# Patient Record
Sex: Female | Born: 1955 | Race: White | Hispanic: No | Marital: Single | Smoking: Never smoker
Health system: Southern US, Community
[De-identification: ages and names within clinical notes are randomized; demographics above are authoritative.]

## PROBLEM LIST (undated history)

## (undated) DIAGNOSIS — R51 Headache: Secondary | ICD-10-CM

## (undated) DIAGNOSIS — K219 Gastro-esophageal reflux disease without esophagitis: Secondary | ICD-10-CM

## (undated) DIAGNOSIS — Z8489 Family history of other specified conditions: Secondary | ICD-10-CM

## (undated) DIAGNOSIS — R519 Headache, unspecified: Secondary | ICD-10-CM

## (undated) HISTORY — PX: FINGER SURGERY: SHX640

---

## 1997-10-25 ENCOUNTER — Ambulatory Visit (HOSPITAL_COMMUNITY): Admission: RE | Admit: 1997-10-25 | Discharge: 1997-10-25 | Payer: Self-pay | Admitting: Obstetrics and Gynecology

## 1998-11-04 ENCOUNTER — Ambulatory Visit (HOSPITAL_COMMUNITY): Admission: RE | Admit: 1998-11-04 | Discharge: 1998-11-04 | Payer: Self-pay | Admitting: Obstetrics and Gynecology

## 1998-11-04 ENCOUNTER — Encounter: Payer: Self-pay | Admitting: Obstetrics and Gynecology

## 1999-01-07 ENCOUNTER — Other Ambulatory Visit: Admission: RE | Admit: 1999-01-07 | Discharge: 1999-01-07 | Payer: Self-pay | Admitting: Obstetrics and Gynecology

## 1999-02-11 ENCOUNTER — Other Ambulatory Visit: Admission: RE | Admit: 1999-02-11 | Discharge: 1999-02-11 | Payer: Self-pay | Admitting: Obstetrics and Gynecology

## 1999-02-11 ENCOUNTER — Encounter (INDEPENDENT_AMBULATORY_CARE_PROVIDER_SITE_OTHER): Payer: Self-pay

## 1999-06-04 ENCOUNTER — Other Ambulatory Visit: Admission: RE | Admit: 1999-06-04 | Discharge: 1999-06-04 | Payer: Self-pay | Admitting: Obstetrics and Gynecology

## 1999-12-17 ENCOUNTER — Ambulatory Visit (HOSPITAL_COMMUNITY): Admission: RE | Admit: 1999-12-17 | Discharge: 1999-12-17 | Payer: Self-pay | Admitting: Obstetrics and Gynecology

## 1999-12-17 ENCOUNTER — Encounter: Payer: Self-pay | Admitting: Obstetrics and Gynecology

## 2000-02-18 ENCOUNTER — Other Ambulatory Visit: Admission: RE | Admit: 2000-02-18 | Discharge: 2000-02-18 | Payer: Self-pay | Admitting: Obstetrics and Gynecology

## 2001-02-08 ENCOUNTER — Encounter: Admission: RE | Admit: 2001-02-08 | Discharge: 2001-02-08 | Payer: Self-pay | Admitting: Family Medicine

## 2001-02-08 ENCOUNTER — Encounter: Payer: Self-pay | Admitting: Family Medicine

## 2001-06-05 ENCOUNTER — Encounter: Payer: Self-pay | Admitting: Obstetrics and Gynecology

## 2001-06-05 ENCOUNTER — Ambulatory Visit (HOSPITAL_COMMUNITY): Admission: RE | Admit: 2001-06-05 | Discharge: 2001-06-05 | Payer: Self-pay | Admitting: Obstetrics and Gynecology

## 2001-06-27 ENCOUNTER — Other Ambulatory Visit: Admission: RE | Admit: 2001-06-27 | Discharge: 2001-06-27 | Payer: Self-pay | Admitting: Obstetrics and Gynecology

## 2002-07-26 ENCOUNTER — Other Ambulatory Visit: Admission: RE | Admit: 2002-07-26 | Discharge: 2002-07-26 | Payer: Self-pay | Admitting: Obstetrics and Gynecology

## 2002-12-25 ENCOUNTER — Encounter (INDEPENDENT_AMBULATORY_CARE_PROVIDER_SITE_OTHER): Payer: Self-pay

## 2002-12-25 ENCOUNTER — Ambulatory Visit (HOSPITAL_COMMUNITY): Admission: RE | Admit: 2002-12-25 | Discharge: 2002-12-25 | Payer: Self-pay | Admitting: Specialist

## 2003-09-23 ENCOUNTER — Other Ambulatory Visit: Admission: RE | Admit: 2003-09-23 | Discharge: 2003-09-23 | Payer: Self-pay | Admitting: Obstetrics and Gynecology

## 2005-01-05 ENCOUNTER — Other Ambulatory Visit: Admission: RE | Admit: 2005-01-05 | Discharge: 2005-01-05 | Payer: Self-pay | Admitting: Obstetrics and Gynecology

## 2005-01-15 ENCOUNTER — Encounter: Admission: RE | Admit: 2005-01-15 | Discharge: 2005-01-15 | Payer: Self-pay | Admitting: Obstetrics and Gynecology

## 2005-08-24 ENCOUNTER — Encounter: Admission: RE | Admit: 2005-08-24 | Discharge: 2005-08-24 | Payer: Self-pay | Admitting: Obstetrics and Gynecology

## 2006-02-10 ENCOUNTER — Encounter: Admission: RE | Admit: 2006-02-10 | Discharge: 2006-02-10 | Payer: Self-pay | Admitting: Obstetrics and Gynecology

## 2007-07-11 ENCOUNTER — Encounter: Admission: RE | Admit: 2007-07-11 | Discharge: 2007-07-11 | Payer: Self-pay | Admitting: Obstetrics and Gynecology

## 2008-07-11 ENCOUNTER — Encounter: Admission: RE | Admit: 2008-07-11 | Discharge: 2008-07-11 | Payer: Self-pay | Admitting: Obstetrics and Gynecology

## 2008-07-17 ENCOUNTER — Encounter: Admission: RE | Admit: 2008-07-17 | Discharge: 2008-07-17 | Payer: Self-pay | Admitting: Obstetrics and Gynecology

## 2009-02-19 ENCOUNTER — Encounter: Admission: RE | Admit: 2009-02-19 | Discharge: 2009-02-19 | Payer: Self-pay | Admitting: Obstetrics and Gynecology

## 2009-02-24 ENCOUNTER — Encounter: Admission: RE | Admit: 2009-02-24 | Discharge: 2009-02-24 | Payer: Self-pay | Admitting: Obstetrics and Gynecology

## 2009-09-22 ENCOUNTER — Encounter: Admission: RE | Admit: 2009-09-22 | Discharge: 2009-09-22 | Payer: Self-pay | Admitting: Obstetrics and Gynecology

## 2010-10-02 NOTE — Op Note (Signed)
   NAME:  Savannah Phillips, Savannah Phillips                           ACCOUNT NO.:  [id number]   MEDICAL RECORD NO.:  [id number]                   PATIENT TYPE:  AMB   LOCATION:  DAY                                  FACILITY:  [facility]   PHYSICIAN:  Ronnell Guadalajara, M.D.                DATE OF BIRTH:  January 23, 1956   DATE OF PROCEDURE:  12/25/2002  DATE OF DISCHARGE:                                 OPERATIVE REPORT   PREOPERATIVE DIAGNOSIS:  Ganglion cyst, dorsum of proximal interphalangeal  joint, left ring finger.   POSTOPERATIVE DIAGNOSIS:  Scar tissue, dorsum of proximal interphalangeal  joint, left ring finger.   OPERATIVE PROCEDURE:  Removal of scar tissue.   DESCRIPTION OF PROCEDURE:  After a little bit of sedation, she was given a  digital block intermetacarpal area volar and on the dorsum with 0.5% plain  Marcaine.  A three-quarter inch rubber drain was then used as an Esmarch  beginning at the fingertip and then wrapping it around the base of the  finger and holding with a hemostat clamp.  It was then removed up to the  base of the finger and a dorsal incision was made over the mass.  It was  freed up subcutaneously.  It was not a ganglion cyst but a thick mass of  scar tissue, which was peeled off the dorsum of the extensor hood and sent  to pathology.  Interrupted closure with interrupted 9-0 nylon, three  sutures.  The tourniquet was removed and the finger held until bleeding  subsided, and then a compression dressing.  She goes to recovery in good  condition.                                               Ronnell Guadalajara, M.D.    PC/MEDQ  D:  12/25/2002  T:  12/25/2002  Job:  [id number]

## 2015-12-02 DIAGNOSIS — H00022 Hordeolum internum right lower eyelid: Secondary | ICD-10-CM | POA: Diagnosis not present

## 2015-12-12 DIAGNOSIS — H00022 Hordeolum internum right lower eyelid: Secondary | ICD-10-CM | POA: Diagnosis not present

## 2015-12-25 DIAGNOSIS — E663 Overweight: Secondary | ICD-10-CM | POA: Diagnosis not present

## 2015-12-25 DIAGNOSIS — K635 Polyp of colon: Secondary | ICD-10-CM | POA: Diagnosis not present

## 2015-12-25 DIAGNOSIS — K30 Functional dyspepsia: Secondary | ICD-10-CM | POA: Diagnosis not present

## 2015-12-25 DIAGNOSIS — G43909 Migraine, unspecified, not intractable, without status migrainosus: Secondary | ICD-10-CM | POA: Diagnosis not present

## 2015-12-25 DIAGNOSIS — Z1389 Encounter for screening for other disorder: Secondary | ICD-10-CM | POA: Diagnosis not present

## 2015-12-25 DIAGNOSIS — K439 Ventral hernia without obstruction or gangrene: Secondary | ICD-10-CM | POA: Diagnosis not present

## 2015-12-25 DIAGNOSIS — N951 Menopausal and female climacteric states: Secondary | ICD-10-CM | POA: Diagnosis not present

## 2016-01-06 DIAGNOSIS — K439 Ventral hernia without obstruction or gangrene: Secondary | ICD-10-CM | POA: Diagnosis not present

## 2016-03-11 DIAGNOSIS — H00025 Hordeolum internum left lower eyelid: Secondary | ICD-10-CM | POA: Diagnosis not present

## 2016-05-12 DIAGNOSIS — H0015 Chalazion left lower eyelid: Secondary | ICD-10-CM | POA: Diagnosis not present

## 2016-06-17 DIAGNOSIS — D6489 Other specified anemias: Secondary | ICD-10-CM | POA: Diagnosis not present

## 2016-06-17 DIAGNOSIS — Z0000 Encounter for general adult medical examination without abnormal findings: Secondary | ICD-10-CM | POA: Diagnosis not present

## 2016-06-17 DIAGNOSIS — R8299 Other abnormal findings in urine: Secondary | ICD-10-CM | POA: Diagnosis not present

## 2016-06-17 DIAGNOSIS — D649 Anemia, unspecified: Secondary | ICD-10-CM | POA: Diagnosis not present

## 2016-06-24 DIAGNOSIS — K635 Polyp of colon: Secondary | ICD-10-CM | POA: Diagnosis not present

## 2016-06-24 DIAGNOSIS — G43909 Migraine, unspecified, not intractable, without status migrainosus: Secondary | ICD-10-CM | POA: Diagnosis not present

## 2016-06-24 DIAGNOSIS — Z1389 Encounter for screening for other disorder: Secondary | ICD-10-CM | POA: Diagnosis not present

## 2016-06-24 DIAGNOSIS — Z23 Encounter for immunization: Secondary | ICD-10-CM | POA: Diagnosis not present

## 2016-06-24 DIAGNOSIS — K439 Ventral hernia without obstruction or gangrene: Secondary | ICD-10-CM | POA: Diagnosis not present

## 2016-06-24 DIAGNOSIS — Z0000 Encounter for general adult medical examination without abnormal findings: Secondary | ICD-10-CM | POA: Diagnosis not present

## 2016-06-24 DIAGNOSIS — D649 Anemia, unspecified: Secondary | ICD-10-CM | POA: Diagnosis not present

## 2016-06-28 DIAGNOSIS — Z1212 Encounter for screening for malignant neoplasm of rectum: Secondary | ICD-10-CM | POA: Diagnosis not present

## 2016-07-01 DIAGNOSIS — D509 Iron deficiency anemia, unspecified: Secondary | ICD-10-CM | POA: Diagnosis not present

## 2016-07-07 DIAGNOSIS — H11003 Unspecified pterygium of eye, bilateral: Secondary | ICD-10-CM | POA: Diagnosis not present

## 2016-07-13 ENCOUNTER — Other Ambulatory Visit: Payer: Self-pay | Admitting: Obstetrics and Gynecology

## 2016-07-13 DIAGNOSIS — Z1231 Encounter for screening mammogram for malignant neoplasm of breast: Secondary | ICD-10-CM

## 2016-07-22 DIAGNOSIS — D649 Anemia, unspecified: Secondary | ICD-10-CM | POA: Diagnosis not present

## 2016-07-27 DIAGNOSIS — Z6827 Body mass index (BMI) 27.0-27.9, adult: Secondary | ICD-10-CM | POA: Diagnosis not present

## 2016-07-27 DIAGNOSIS — Z01419 Encounter for gynecological examination (general) (routine) without abnormal findings: Secondary | ICD-10-CM | POA: Diagnosis not present

## 2016-07-30 ENCOUNTER — Ambulatory Visit
Admission: RE | Admit: 2016-07-30 | Discharge: 2016-07-30 | Disposition: A | Discharge status: Home / Self Care | Payer: BLUE CROSS/BLUE SHIELD | Source: Ambulatory Visit | Attending: Obstetrics and Gynecology | Admitting: Obstetrics and Gynecology

## 2016-07-30 DIAGNOSIS — Z1231 Encounter for screening mammogram for malignant neoplasm of breast: Secondary | ICD-10-CM | POA: Diagnosis not present

## 2016-08-10 DIAGNOSIS — D509 Iron deficiency anemia, unspecified: Secondary | ICD-10-CM | POA: Diagnosis not present

## 2016-08-10 DIAGNOSIS — K621 Rectal polyp: Secondary | ICD-10-CM | POA: Diagnosis not present

## 2016-08-10 DIAGNOSIS — K2900 Acute gastritis without bleeding: Secondary | ICD-10-CM | POA: Diagnosis not present

## 2016-08-10 DIAGNOSIS — K640 First degree hemorrhoids: Secondary | ICD-10-CM | POA: Diagnosis not present

## 2016-08-10 DIAGNOSIS — K317 Polyp of stomach and duodenum: Secondary | ICD-10-CM | POA: Diagnosis not present

## 2016-08-11 DIAGNOSIS — Z13820 Encounter for screening for osteoporosis: Secondary | ICD-10-CM | POA: Diagnosis not present

## 2016-08-12 DIAGNOSIS — D509 Iron deficiency anemia, unspecified: Secondary | ICD-10-CM | POA: Diagnosis not present

## 2016-08-12 DIAGNOSIS — K2900 Acute gastritis without bleeding: Secondary | ICD-10-CM | POA: Diagnosis not present

## 2016-08-12 DIAGNOSIS — K621 Rectal polyp: Secondary | ICD-10-CM | POA: Diagnosis not present

## 2016-12-21 DIAGNOSIS — E663 Overweight: Secondary | ICD-10-CM | POA: Diagnosis not present

## 2016-12-21 DIAGNOSIS — D649 Anemia, unspecified: Secondary | ICD-10-CM | POA: Diagnosis not present

## 2016-12-21 DIAGNOSIS — K30 Functional dyspepsia: Secondary | ICD-10-CM | POA: Diagnosis not present

## 2016-12-21 DIAGNOSIS — K635 Polyp of colon: Secondary | ICD-10-CM | POA: Diagnosis not present

## 2017-02-11 DIAGNOSIS — Z6825 Body mass index (BMI) 25.0-25.9, adult: Secondary | ICD-10-CM | POA: Diagnosis not present

## 2017-02-11 DIAGNOSIS — R42 Dizziness and giddiness: Secondary | ICD-10-CM | POA: Diagnosis not present

## 2017-02-11 DIAGNOSIS — D649 Anemia, unspecified: Secondary | ICD-10-CM | POA: Diagnosis not present

## 2017-03-12 DIAGNOSIS — Z23 Encounter for immunization: Secondary | ICD-10-CM | POA: Diagnosis not present

## 2017-04-26 DIAGNOSIS — H00022 Hordeolum internum right lower eyelid: Secondary | ICD-10-CM | POA: Diagnosis not present

## 2017-04-28 DIAGNOSIS — H00022 Hordeolum internum right lower eyelid: Secondary | ICD-10-CM | POA: Diagnosis not present

## 2017-06-15 ENCOUNTER — Other Ambulatory Visit: Payer: Self-pay | Admitting: Obstetrics and Gynecology

## 2017-06-15 DIAGNOSIS — Z1231 Encounter for screening mammogram for malignant neoplasm of breast: Secondary | ICD-10-CM

## 2017-06-20 DIAGNOSIS — M859 Disorder of bone density and structure, unspecified: Secondary | ICD-10-CM | POA: Diagnosis not present

## 2017-06-20 DIAGNOSIS — Z0000 Encounter for general adult medical examination without abnormal findings: Secondary | ICD-10-CM | POA: Diagnosis not present

## 2017-06-20 DIAGNOSIS — E7849 Other hyperlipidemia: Secondary | ICD-10-CM | POA: Diagnosis not present

## 2017-06-22 DIAGNOSIS — H2513 Age-related nuclear cataract, bilateral: Secondary | ICD-10-CM | POA: Diagnosis not present

## 2017-06-27 DIAGNOSIS — E559 Vitamin D deficiency, unspecified: Secondary | ICD-10-CM | POA: Diagnosis not present

## 2017-06-27 DIAGNOSIS — M859 Disorder of bone density and structure, unspecified: Secondary | ICD-10-CM | POA: Diagnosis not present

## 2017-06-27 DIAGNOSIS — E7849 Other hyperlipidemia: Secondary | ICD-10-CM | POA: Diagnosis not present

## 2017-06-27 DIAGNOSIS — Z6826 Body mass index (BMI) 26.0-26.9, adult: Secondary | ICD-10-CM | POA: Diagnosis not present

## 2017-06-27 DIAGNOSIS — Z1389 Encounter for screening for other disorder: Secondary | ICD-10-CM | POA: Diagnosis not present

## 2017-06-27 DIAGNOSIS — Z0000 Encounter for general adult medical examination without abnormal findings: Secondary | ICD-10-CM | POA: Diagnosis not present

## 2017-06-27 DIAGNOSIS — M1990 Unspecified osteoarthritis, unspecified site: Secondary | ICD-10-CM | POA: Diagnosis not present

## 2017-06-29 DIAGNOSIS — Z23 Encounter for immunization: Secondary | ICD-10-CM | POA: Diagnosis not present

## 2017-08-01 ENCOUNTER — Encounter: Payer: Self-pay | Admitting: Radiology

## 2017-08-01 ENCOUNTER — Ambulatory Visit
Admission: RE | Admit: 2017-08-01 | Discharge: 2017-08-01 | Disposition: A | Discharge status: Home / Self Care | Payer: BLUE CROSS/BLUE SHIELD | Source: Ambulatory Visit | Attending: Obstetrics and Gynecology | Admitting: Obstetrics and Gynecology

## 2017-08-01 DIAGNOSIS — Z1231 Encounter for screening mammogram for malignant neoplasm of breast: Secondary | ICD-10-CM | POA: Diagnosis not present

## 2017-08-02 DIAGNOSIS — Z6826 Body mass index (BMI) 26.0-26.9, adult: Secondary | ICD-10-CM | POA: Diagnosis not present

## 2017-08-02 DIAGNOSIS — Z01419 Encounter for gynecological examination (general) (routine) without abnormal findings: Secondary | ICD-10-CM | POA: Diagnosis not present

## 2017-08-29 ENCOUNTER — Ambulatory Visit: Payer: Self-pay | Admitting: General Surgery

## 2017-08-29 DIAGNOSIS — K439 Ventral hernia without obstruction or gangrene: Secondary | ICD-10-CM | POA: Diagnosis not present

## 2017-08-29 NOTE — H&P (Signed)
History of Present Illness Savannah Filler MD; 08/29/2017 3:41 PM) The patient is a 62 year old female who presents with an abdominal wall hernia. Patient is a 62 year old female who is referred by Dr. Creola Corn for evaluation of a ventral hernia. Patient states she's noticed a bulge the last to 73yr. She states that time its discoverable however there is no sharp pain associated with it. She does state that after eating she does notice some swelling and some pain. She's had no signs or symptoms of incarceration or granulation.  She's had no previous abdominal surgeries.   Allergies Savannah Phillips; 08/29/2017 3:27 PM) No Known Drug Allergies [01/06/2016]: Allergies Reconciled   Medication History Savannah Phillips; 08/29/2017 3:28 PM) Estradiol (0.0375MG /24HR Patch TW, Transdermal) Active. No Current Medications (Taken starting 08/29/2017) Estradiol (0.05MG /24HR Patch TW, Transdermal) Active. Fluorometholone (0.1% Suspension, Ophthalmic) Active. Progesterone Micronized (100MG  Capsule, Oral) Active. Medications Reconciled    Review of Systems Savannah Filler, MD; 08/29/2017 3:42 PM) General Present- Night Sweats. Not Present- Appetite Loss, Chills, Fatigue, Fever, Weight Gain and Weight Loss. Skin Present- Dryness. Not Present- Change in Wart/Mole, Hives, Jaundice, New Lesions, Non-Healing Wounds, Rash and Ulcer. HEENT Present- Hearing Loss, Ringing in the Ears and Wears glasses/contact lenses. Not Present- Earache, Hoarseness, Nose Bleed, Oral Ulcers, Seasonal Allergies, Sinus Pain, Sore Throat, Visual Disturbances and Yellow Eyes. Respiratory Present- Snoring. Not Present- Bloody sputum, Chronic Cough, Difficulty Breathing and Wheezing. Breast Not Present- Breast Mass, Breast Pain, Nipple Discharge and Skin Changes. Cardiovascular Present- Leg Cramps. Not Present- Chest Pain, Difficulty Breathing Lying Down, Palpitations, Rapid Heart Rate, Shortness of Breath and Swelling of  Extremities. Gastrointestinal Present- Abdominal Pain, Bloating, Hemorrhoids, Indigestion and Nausea. Not Present- Bloody Stool, Change in Bowel Habits, Chronic diarrhea, Constipation, Difficulty Swallowing, Excessive gas, Gets full quickly at meals, Rectal Pain and Vomiting. Female Genitourinary Not Present- Frequency, Nocturia, Painful Urination, Pelvic Pain and Urgency. Musculoskeletal Present- Back Pain, Joint Pain and Joint Stiffness. Not Present- Muscle Pain, Muscle Weakness and Swelling of Extremities. Neurological Present- Headaches. Not Present- Decreased Memory, Fainting, Numbness, Seizures, Tingling, Tremor, Trouble walking and Weakness. Psychiatric Not Present- Anxiety, Bipolar, Change in Sleep Pattern, Depression, Fearful and Frequent crying. Endocrine Present- Hot flashes. Not Present- Cold Intolerance, Excessive Hunger, Hair Changes, Heat Intolerance and New Diabetes. Hematology Not Present- Blood Thinners, Easy Bruising, Excessive bleeding, Gland problems, HIV and Persistent Infections.  Vitals Savannah Phillips; 08/29/2017 3:29 PM) 08/29/2017 3:28 PM Weight: 161 lb Height: 64in Body Surface Area: 1.78 m Body Mass Index: 27.64 kg/m  Temp.: 98.92F(Oral)  Pulse: 92 (Regular)  BP: 132/82 (Sitting, Left Arm, Standard)       Physical Exam Savannah Filler MD; 08/29/2017 3:41 PM) The physical exam findings are as follows: Note:Constitutional: No acute distress, conversant, appears stated age  Eyes: Anicteric sclerae, moist conjunctiva, no lid lag  Neck: No thyromegaly, trachea midline, no cervical lymphadenopathy  Lungs: Clear to auscultation biilaterally, normal respiratory effot  Cardiovascular: regular rate & rhythm, no murmurs, no peripheal edema, pedal pulses 2+  GI: Soft, no masses or hepatosplenomegaly, non-tender to palpation, approximate 2 cm supraumbilical midline hernia.  MSK: Normal gait, no clubbing cyanosis, edema  Skin: No rashes, palpation  reveals normal skin turgor  Psychiatric: Appropriate judgment and insight, oriented to person, place, and time    Assessment & Plan Savannah Filler MD; 08/29/2017 3:41 PM) VENTRAL HERNIA WITHOUT OBSTRUCTION OR GANGRENE (K43.9) Impression: 62 year old female with a primary ventral hernia and pain  1. The patient will like to proceed to  the operating room for laparoscopic ventral hernia hernia repair with mesh.  2. I discussed with the patient the signs and symptoms of incarceration and strangulation and the need to proceed to the ER should they occur.  3. I discussed with the patient the risks and benefits of the procedure to include but not limited to: Infection, bleeding, damage to surrounding structures, possible need for further surgery, possible nerve pain, and possible recurrence. The patient was understanding and wishes to proceed.

## 2017-10-12 NOTE — Patient Instructions (Addendum)
Savannah Phillips  10/12/2017   Your procedure is scheduled on: Thursday 10/20/2017  Report to [facility] Main  Entrance              Report to admitting at  0530  AM    Call this number if you have problems the morning of surgery [phone]    Remember: Do not eat food or drink liquids :After Midnight.     Take these medicines the morning of surgery with A SIP OF WATER: none                                You may not have any metal on your body including hair pins and              piercings  Do not wear jewelry, make-up, lotions, powders or perfumes, deodorant             Do not wear nail polish.  Do not shave  48 hours prior to surgery.              Do not bring valuables to the hospital. [facility] IS NOT             RESPONSIBLE   FOR VALUABLES.  Contacts, dentures or bridgework may not be worn into surgery.  Leave suitcase in the car. After surgery it may be brought to your room.     Patients discharged the day of surgery will not be allowed to drive home.  Name and phone number of your driver: still working on arrangements for transportation to get here and home               Please read over the following fact sheets you were given: _____________________________________________________________________             [facility] - Preparing for Surgery Before surgery, you can play an important role.  Because skin is not sterile, your skin needs to be as free of germs as possible.  You can reduce the number of germs on your skin by washing with CHG (chlorahexidine gluconate) soap before surgery.  CHG is an antiseptic cleaner which kills germs and bonds with the skin to continue killing germs even after washing. Please DO NOT use if you have an allergy to CHG or antibacterial soaps.  If your skin becomes reddened/irritated stop using the CHG and inform your nurse when you arrive at Short Stay. Do not shave (including legs and underarms) for at least 48  hours prior to the first CHG shower.  You may shave your face/neck. Please follow these instructions carefully:  1.  Shower with CHG Soap the night before surgery and the  morning of Surgery.  2.  If you choose to wash your hair, wash your hair first as usual with your  normal  shampoo.  3.  After you shampoo, rinse your hair and body thoroughly to remove the  shampoo.                           4.  Use CHG as you would any other liquid soap.  You can apply chg directly  to the skin and wash  Gently with a scrungie or clean washcloth.  5.  Apply the CHG Soap to your body ONLY FROM THE NECK DOWN.   Do not use on face/ open                           Wound or open sores. Avoid contact with eyes, ears mouth and genitals (private parts).                       Wash face,  Genitals (private parts) with your normal soap.             6.  Wash thoroughly, paying special attention to the area where your surgery  will be performed.  7.  Thoroughly rinse your body with warm water from the neck down.  8.  DO NOT shower/wash with your normal soap after using and rinsing off  the CHG Soap.                9.  Pat yourself dry with a clean towel.            10.  Wear clean pajamas.            11.  Place clean sheets on your bed the night of your first shower and do not  sleep with pets. Day of Surgery : Do not apply any lotions/deodorants the morning of surgery.  Please wear clean clothes to the hospital/surgery center.  FAILURE TO FOLLOW THESE INSTRUCTIONS MAY RESULT IN THE CANCELLATION OF YOUR SURGERY PATIENT SIGNATURE_________________________________  NURSE SIGNATURE__________________________________  ________________________________________________________________________

## 2017-10-14 ENCOUNTER — Encounter (HOSPITAL_COMMUNITY): Payer: Self-pay

## 2017-10-14 ENCOUNTER — Other Ambulatory Visit: Payer: Self-pay

## 2017-10-14 ENCOUNTER — Encounter (HOSPITAL_COMMUNITY)
Admission: RE | Admit: 2017-10-14 | Discharge: 2017-10-14 | Disposition: A | Discharge status: Home / Self Care | Payer: BLUE CROSS/BLUE SHIELD | Source: Ambulatory Visit | Attending: General Surgery | Admitting: General Surgery

## 2017-10-14 DIAGNOSIS — K439 Ventral hernia without obstruction or gangrene: Secondary | ICD-10-CM | POA: Insufficient documentation

## 2017-10-14 DIAGNOSIS — Z01812 Encounter for preprocedural laboratory examination: Secondary | ICD-10-CM | POA: Diagnosis not present

## 2017-10-14 HISTORY — DX: Gastro-esophageal reflux disease without esophagitis: K21.9

## 2017-10-14 HISTORY — DX: Headache: R51

## 2017-10-14 HISTORY — DX: Family history of other specified conditions: Z84.89

## 2017-10-14 HISTORY — DX: Headache, unspecified: R51.9

## 2017-10-14 LAB — CBC
HCT: 37.8 % (ref 36.0–46.0)
Hemoglobin: 12.2 g/dL (ref 12.0–15.0)
MCH: 26.8 pg (ref 26.0–34.0)
MCHC: 32.3 g/dL (ref 30.0–36.0)
MCV: 82.9 fL (ref 78.0–100.0)
Platelets: 295 10*3/uL (ref 150–400)
RBC: 4.56 MIL/uL (ref 3.87–5.11)
RDW: 14.9 % (ref 11.5–15.5)
WBC: 6.7 10*3/uL (ref 4.0–10.5)

## 2017-10-20 ENCOUNTER — Ambulatory Visit (HOSPITAL_COMMUNITY): Payer: BLUE CROSS/BLUE SHIELD | Admitting: Anesthesiology

## 2017-10-20 ENCOUNTER — Encounter (HOSPITAL_COMMUNITY): Payer: Self-pay

## 2017-10-20 ENCOUNTER — Ambulatory Visit (HOSPITAL_COMMUNITY)
Admission: RE | Admit: 2017-10-20 | Discharge: 2017-10-20 | Disposition: A | Discharge status: Home / Self Care | Payer: BLUE CROSS/BLUE SHIELD | Source: Ambulatory Visit | Attending: General Surgery | Admitting: General Surgery

## 2017-10-20 ENCOUNTER — Encounter (HOSPITAL_COMMUNITY)
Admission: RE | Disposition: A | Discharge status: Home / Self Care | Payer: Self-pay | Source: Ambulatory Visit | Attending: General Surgery

## 2017-10-20 DIAGNOSIS — K219 Gastro-esophageal reflux disease without esophagitis: Secondary | ICD-10-CM | POA: Diagnosis not present

## 2017-10-20 DIAGNOSIS — K439 Ventral hernia without obstruction or gangrene: Secondary | ICD-10-CM | POA: Insufficient documentation

## 2017-10-20 HISTORY — PX: VENTRAL HERNIA REPAIR: SHX424

## 2017-10-20 HISTORY — PX: INSERTION OF MESH: SHX5868

## 2017-10-20 SURGERY — REPAIR, HERNIA, VENTRAL, LAPAROSCOPIC
Anesthesia: General | Site: Abdomen

## 2017-10-20 MED ORDER — SUGAMMADEX SODIUM 200 MG/2ML IV SOLN
INTRAVENOUS | Status: AC
  Filled 2017-10-20: qty 2

## 2017-10-20 MED ORDER — PROMETHAZINE-PHENYLEPHRINE 6.25-5 MG/5ML PO SYRP: 5.0000 mL | ORAL_SOLUTION | ORAL | Status: DC | PRN

## 2017-10-20 MED ORDER — CHLORHEXIDINE GLUCONATE CLOTH 2 % EX PADS: 6.0000 | MEDICATED_PAD | Freq: Once | CUTANEOUS | Status: DC

## 2017-10-20 MED ORDER — ROCURONIUM BROMIDE 50 MG/5ML IV SOSY
PREFILLED_SYRINGE | INTRAVENOUS | Status: DC | PRN
  Administered 2017-10-20: 50 mg via INTRAVENOUS

## 2017-10-20 MED ORDER — ONDANSETRON HCL 4 MG/2ML IJ SOLN
INTRAMUSCULAR | Status: DC | PRN
  Administered 2017-10-20: 4 mg via INTRAVENOUS

## 2017-10-20 MED ORDER — FENTANYL CITRATE (PF) 100 MCG/2ML IJ SOLN
INTRAMUSCULAR | Status: AC
  Filled 2017-10-20: qty 2

## 2017-10-20 MED ORDER — PROMETHAZINE HCL 25 MG/ML IJ SOLN
INTRAMUSCULAR | Status: AC
  Administered 2017-10-20: 6.25 mg
  Filled 2017-10-20: qty 1

## 2017-10-20 MED ORDER — DEXAMETHASONE SODIUM PHOSPHATE 10 MG/ML IJ SOLN
INTRAMUSCULAR | Status: AC
  Filled 2017-10-20: qty 1

## 2017-10-20 MED ORDER — FENTANYL CITRATE (PF) 100 MCG/2ML IJ SOLN
INTRAMUSCULAR | Status: DC | PRN
  Administered 2017-10-20 (×2): 50 ug via INTRAVENOUS

## 2017-10-20 MED ORDER — KETOROLAC TROMETHAMINE 30 MG/ML IJ SOLN
INTRAMUSCULAR | Status: AC
  Filled 2017-10-20: qty 1

## 2017-10-20 MED ORDER — GABAPENTIN 300 MG PO CAPS
300.0000 mg | ORAL_CAPSULE | ORAL | Status: AC
  Administered 2017-10-20: 300 mg via ORAL
  Filled 2017-10-20: qty 1

## 2017-10-20 MED ORDER — CEFAZOLIN SODIUM-DEXTROSE 2-4 GM/100ML-% IV SOLN
2.0000 g | INTRAVENOUS | Status: AC
  Administered 2017-10-20: 2 g via INTRAVENOUS

## 2017-10-20 MED ORDER — 0.9 % SODIUM CHLORIDE (POUR BTL) OPTIME
TOPICAL | Status: DC | PRN
  Administered 2017-10-20: 1000 mL

## 2017-10-20 MED ORDER — KETOROLAC TROMETHAMINE 30 MG/ML IJ SOLN
INTRAMUSCULAR | Status: DC | PRN
  Administered 2017-10-20: 30 mg via INTRAVENOUS

## 2017-10-20 MED ORDER — TRAMADOL HCL 50 MG PO TABS: 50.0000 mg | ORAL_TABLET | Freq: Four times a day (QID) | ORAL | 0 refills | Status: AC | PRN

## 2017-10-20 MED ORDER — LIDOCAINE 2% (20 MG/ML) 5 ML SYRINGE
INTRAMUSCULAR | Status: AC
  Filled 2017-10-20: qty 5

## 2017-10-20 MED ORDER — CEFAZOLIN SODIUM-DEXTROSE 2-4 GM/100ML-% IV SOLN
INTRAVENOUS | Status: AC
  Filled 2017-10-20: qty 100

## 2017-10-20 MED ORDER — HYDROMORPHONE HCL 1 MG/ML IJ SOLN
INTRAMUSCULAR | Status: AC
  Administered 2017-10-20: 0.25 mg via INTRAVENOUS
  Filled 2017-10-20: qty 1

## 2017-10-20 MED ORDER — ACETAMINOPHEN 500 MG PO TABS
1000.0000 mg | ORAL_TABLET | ORAL | Status: AC
  Administered 2017-10-20: 1000 mg via ORAL
  Filled 2017-10-20: qty 2

## 2017-10-20 MED ORDER — LACTATED RINGERS IV SOLN
INTRAVENOUS | Status: DC
  Administered 2017-10-20: 07:00:00 via INTRAVENOUS

## 2017-10-20 MED ORDER — BUPIVACAINE-EPINEPHRINE (PF) 0.25% -1:200000 IJ SOLN
INTRAMUSCULAR | Status: AC
  Filled 2017-10-20: qty 30

## 2017-10-20 MED ORDER — MIDAZOLAM HCL 2 MG/2ML IJ SOLN
INTRAMUSCULAR | Status: DC | PRN
  Administered 2017-10-20: 2 mg via INTRAVENOUS

## 2017-10-20 MED ORDER — CELECOXIB 200 MG PO CAPS
200.0000 mg | ORAL_CAPSULE | ORAL | Status: AC
  Administered 2017-10-20: 200 mg via ORAL
  Filled 2017-10-20: qty 1

## 2017-10-20 MED ORDER — ONDANSETRON HCL 4 MG/2ML IJ SOLN: 4.0000 mg | Freq: Once | INTRAMUSCULAR | Status: DC | PRN

## 2017-10-20 MED ORDER — MEPERIDINE HCL 50 MG/ML IJ SOLN: 6.2500 mg | INTRAMUSCULAR | Status: DC | PRN

## 2017-10-20 MED ORDER — ROCURONIUM BROMIDE 10 MG/ML (PF) SYRINGE
PREFILLED_SYRINGE | INTRAVENOUS | Status: AC
  Filled 2017-10-20: qty 5

## 2017-10-20 MED ORDER — LIDOCAINE 2% (20 MG/ML) 5 ML SYRINGE
INTRAMUSCULAR | Status: DC | PRN
  Administered 2017-10-20: 100 mg via INTRAVENOUS

## 2017-10-20 MED ORDER — DEXAMETHASONE SODIUM PHOSPHATE 10 MG/ML IJ SOLN
INTRAMUSCULAR | Status: DC | PRN
  Administered 2017-10-20: 10 mg via INTRAVENOUS

## 2017-10-20 MED ORDER — SUGAMMADEX SODIUM 200 MG/2ML IV SOLN
INTRAVENOUS | Status: DC | PRN
  Administered 2017-10-20: 200 mg via INTRAVENOUS

## 2017-10-20 MED ORDER — ONDANSETRON HCL 4 MG/2ML IJ SOLN
INTRAMUSCULAR | Status: AC
  Filled 2017-10-20: qty 2

## 2017-10-20 MED ORDER — HYDROMORPHONE HCL 1 MG/ML IJ SOLN
0.2500 mg | INTRAMUSCULAR | Status: DC | PRN
  Administered 2017-10-20 (×3): 0.25 mg via INTRAVENOUS

## 2017-10-20 MED ORDER — PROPOFOL 10 MG/ML IV BOLUS
INTRAVENOUS | Status: AC
  Filled 2017-10-20: qty 40

## 2017-10-20 MED ORDER — BUPIVACAINE-EPINEPHRINE 0.25% -1:200000 IJ SOLN
INTRAMUSCULAR | Status: DC | PRN
  Administered 2017-10-20: 15 mL

## 2017-10-20 MED ORDER — PROPOFOL 10 MG/ML IV BOLUS
INTRAVENOUS | Status: DC | PRN
  Administered 2017-10-20: 100 mg via INTRAVENOUS

## 2017-10-20 MED ORDER — MIDAZOLAM HCL 2 MG/2ML IJ SOLN
INTRAMUSCULAR | Status: AC
  Filled 2017-10-20: qty 2

## 2017-10-20 SURGICAL SUPPLY — 37 items
ADH SKN CLS APL DERMABOND .7 (GAUZE/BANDAGES/DRESSINGS) ×1
APPLIER CLIP 5 13 M/L LIGAMAX5 (MISCELLANEOUS)
APR CLP MED LRG 5 ANG JAW (MISCELLANEOUS)
BINDER ABDOMINAL 12 ML 46-62 (SOFTGOODS) IMPLANT
CABLE HIGH FREQUENCY MONO STRZ (ELECTRODE) ×2 IMPLANT
CHLORAPREP W/TINT 26ML (MISCELLANEOUS) ×2 IMPLANT
CLIP APPLIE 5 13 M/L LIGAMAX5 (MISCELLANEOUS) IMPLANT
DECANTER SPIKE VIAL GLASS SM (MISCELLANEOUS) ×2 IMPLANT
DERMABOND ADVANCED (GAUZE/BANDAGES/DRESSINGS) ×1
DERMABOND ADVANCED .7 DNX12 (GAUZE/BANDAGES/DRESSINGS) ×1 IMPLANT
DEVICE SECURE STRAP 25 ABSORB (INSTRUMENTS) ×1 IMPLANT
DEVICE TROCAR PUNCTURE CLOSURE (ENDOMECHANICALS) ×2 IMPLANT
ELECT REM PT RETURN 15FT ADLT (MISCELLANEOUS) ×2 IMPLANT
GLOVE BIO SURGEON STRL SZ7.5 (GLOVE) ×2 IMPLANT
GOWN STRL REUS W/TWL XL LVL3 (GOWN DISPOSABLE) ×6 IMPLANT
KIT BASIN OR (CUSTOM PROCEDURE TRAY) ×2 IMPLANT
MARKER SKIN DUAL TIP RULER LAB (MISCELLANEOUS) ×2 IMPLANT
MESH VENTRALIGHT ST 4.5IN (Mesh General) ×1 IMPLANT
NDL INSUFFLATION 14GA 120MM (NEEDLE) ×1 IMPLANT
NEEDLE INSUFFLATION 14GA 120MM (NEEDLE) ×2 IMPLANT
PAD POSITIONING PINK XL (MISCELLANEOUS) IMPLANT
POSITIONER SURGICAL ARM (MISCELLANEOUS) IMPLANT
SCISSORS LAP 5X35 DISP (ENDOMECHANICALS) ×2 IMPLANT
SET IRRIG TUBING LAPAROSCOPIC (IRRIGATION / IRRIGATOR) IMPLANT
SHEARS HARMONIC ACE PLUS 36CM (ENDOMECHANICALS) IMPLANT
SLEEVE XCEL OPT CAN 5 100 (ENDOMECHANICALS) ×4 IMPLANT
SUT CHROMIC 2 0 SH (SUTURE) ×2 IMPLANT
SUT MNCRL AB 4-0 PS2 18 (SUTURE) ×2 IMPLANT
SUT NOVA NAB GS-21 1 T12 (SUTURE) ×1 IMPLANT
SUT PDS AB 0 CT1 36 (SUTURE) IMPLANT
SUT PDS AB 1 CT1 27 (SUTURE) IMPLANT
SUT PROLENE 2 0 KS (SUTURE) ×2 IMPLANT
TAPE CLOTH 4X10 WHT NS (GAUZE/BANDAGES/DRESSINGS) IMPLANT
TOWEL OR 17X26 10 PK STRL BLUE (TOWEL DISPOSABLE) ×2 IMPLANT
TOWEL OR NON WOVEN STRL DISP B (DISPOSABLE) ×2 IMPLANT
TRAY LAPAROSCOPIC (CUSTOM PROCEDURE TRAY) ×2 IMPLANT
TUBING INSUF HEATED (TUBING) ×2 IMPLANT

## 2017-10-20 NOTE — Discharge Instructions (Signed)
[facility] _______[facility] ° °HERNIA REPAIR: POST OP INSTRUCTIONS ° °Always review your discharge instruction sheet given to you by the facility where your surgery was performed. °IF YOU HAVE DISABILITY OR FAMILY LEAVE FORMS, YOU MUST BRING THEM TO THE OFFICE FOR PROCESSING.   °DO NOT GIVE THEM TO YOUR DOCTOR. ° °1. A  prescription for pain medication may be given to you upon discharge.  Take your pain medication as prescribed, if needed.  If narcotic pain medicine is not needed, then you may take acetaminophen (Tylenol) or ibuprofen (Advil) as needed. °2. Take your usually prescribed medications unless otherwise directed. °If you need a refill on your pain medication, please contact your pharmacy.  They will contact our office to request authorization. Prescriptions will not be filled after 5 pm or on week-ends. °3. You should follow a light diet the first 24 hours after arrival home, such as soup and crackers, etc.  Be sure to include lots of fluids daily.  Resume your normal diet the day after surgery. °4.Most patients will experience some swelling and bruising around the umbilicus or in the groin and scrotum.  Ice packs and reclining will help.  Swelling and bruising can take several days to resolve.  °6. It is common to experience some constipation if taking pain medication after surgery.  Increasing fluid intake and taking a stool softener (such as Colace) will usually help or prevent this problem from occurring.  A mild laxative (Milk of Magnesia or Miralax) should be taken according to package directions if there are no bowel movements after 48 hours. °7. Unless discharge instructions indicate otherwise, you may remove your bandages 24-48 hours after surgery, and you may shower at that time.  You may have steri-strips (small skin tapes) in place directly over the incision.  These strips should be left on the skin for 7-10 days.  If your surgeon used skin glue on the incision, you may shower in  24 hours.  The glue will flake off over the next 2-3 weeks.  Any sutures or staples will be removed at the office during your follow-up visit. °8. ACTIVITIES:  You may resume regular (light) daily activities beginning the next day--such as daily self-care, walking, climbing stairs--gradually increasing activities as tolerated.  You may have sexual intercourse when it is comfortable.  Refrain from any heavy lifting or straining until approved by your doctor. ° °a.You may drive when you are no longer taking prescription pain medication, you can comfortably wear a seatbelt, and you can safely maneuver your car and apply brakes. °b.RETURN TO WORK:   °_____________________________________________ ° °9.You should see your doctor in the office for a follow-up appointment approximately 2-3 weeks after your surgery.  Make sure that you call for this appointment within a day or two after you arrive home to insure a convenient appointment time. °10.OTHER INSTRUCTIONS: _________________________ °   _____________________________________ ° °WHEN TO CALL YOUR DOCTOR: °1. Fever over 101.0 °2. Inability to urinate °3. Nausea and/or vomiting °4. Extreme swelling or bruising °5. Continued bleeding from incision. °6. Increased pain, redness, or drainage from the incision ° °The clinic staff is available to answer your questions during regular business hours.  Please don’t hesitate to call and ask to speak to one of the nurses for clinical concerns.  If you have a medical emergency, go to the nearest emergency room or call 911.  A surgeon from [facility] is always on call at the hospital ° ° °[address], [address], [place], [place]  [address] ? ° [address] [address], [place], [place]   [address] °[phone] ? [phone] ? FAX [phone] °Web site: [email or web address] ° °

## 2017-10-20 NOTE — Transfer of Care (Signed)
Immediate Anesthesia Transfer of Care Note  Patient: Savannah Phillips  Procedure(s) Performed: LAPAROSCOPIC VENTRAL HERNIA (N/A Abdomen) INSERTION OF MESH (N/A Abdomen)  Patient Location: PACU  Anesthesia Type:General  Level of Consciousness: awake, alert  and oriented  Airway & Oxygen Therapy: Patient Spontanous Breathing and Patient connected to face mask oxygen  Post-op Assessment: Report given to RN and Post -op Vital signs reviewed and stable  Post vital signs: Reviewed and stable  Last Vitals:  Vitals Value Taken Time  BP 131/78 10/20/2017  8:12 AM  Temp    Pulse 70 10/20/2017  8:14 AM  Resp 20 10/20/2017  8:14 AM  SpO2 100 % 10/20/2017  8:14 AM  Vitals shown include unvalidated device data.  Last Pain:  Vitals:   10/20/17 [id number]  TempSrc:   PainSc: 0-No pain         Complications: No apparent anesthesia complications

## 2017-10-20 NOTE — Anesthesia Procedure Notes (Signed)
Procedure Name: Intubation Date/Time: 10/20/2017 7:32 AM Performed by: Pearson Grippeobertson, Rachna Schonberger M, CRNA Pre-anesthesia Checklist: Patient identified, Emergency Drugs available, Suction available and Patient being monitored Patient Re-evaluated:Patient Re-evaluated prior to induction Oxygen Delivery Method: Circle system utilized Preoxygenation: Pre-oxygenation with 100% oxygen Induction Type: IV induction Ventilation: Mask ventilation without difficulty Laryngoscope Size: 2 and Miller Grade View: Grade I Tube type: Oral Tube size: 7.0 mm Number of attempts: 1 Airway Equipment and Method: Stylet and Oral airway Placement Confirmation: ETT inserted through vocal cords under direct vision,  positive ETCO2 and breath sounds checked- equal and bilateral Secured at: 21 cm Tube secured with: Tape Dental Injury: Teeth and Oropharynx as per pre-operative assessment

## 2017-10-20 NOTE — Anesthesia Preprocedure Evaluation (Signed)
Anesthesia Evaluation  Patient identified by MRN, date of birth, ID band Patient awake    Reviewed: Allergy & Precautions, NPO status , Patient's Chart, lab work & pertinent test results  Airway Mallampati: I  TM Distance: >3 FB Neck ROM: Full    Dental   Pulmonary    Pulmonary exam normal        Cardiovascular Normal cardiovascular exam     Neuro/Psych    GI/Hepatic GERD  Controlled,  Endo/Other    Renal/GU      Musculoskeletal   Abdominal   Peds  Hematology   Anesthesia Other Findings   Reproductive/Obstetrics                             Anesthesia Physical Anesthesia Plan  ASA: II  Anesthesia Plan: General   Post-op Pain Management:    Induction: Intravenous  PONV Risk Score and Plan: 3 and Midazolam, Ondansetron and Dexamethasone  Airway Management Planned: Oral ETT  Additional Equipment:   Intra-op Plan:   Post-operative Plan: Extubation in OR  Informed Consent: I have reviewed the patients History and Physical, chart, labs and discussed the procedure including the risks, benefits and alternatives for the proposed anesthesia with the patient or authorized representative who has indicated his/her understanding and acceptance.     Plan Discussed with: CRNA and Surgeon  Anesthesia Plan Comments:         Anesthesia Quick Evaluation

## 2017-10-20 NOTE — Op Note (Signed)
10/20/2017  8:04 AM  PATIENT:  Savannah Phillips  62 y.o. female  PRE-OPERATIVE DIAGNOSIS:  ventral hernia  POST-OPERATIVE DIAGNOSIS:  ventral hernia  PROCEDURE:  Procedure(s): LAPAROSCOPIC VENTRAL HERNIA (N/A) INSERTION OF MESH (N/A)  SURGEON:  Surgeon(s) and Role:    Axel Filler* Savannah Craun, MD - Primary   ANESTHESIA:   local and general  EBL:  minimal   BLOOD ADMINISTERED:none  DRAINS: none   LOCAL MEDICATIONS USED:  BUPIVICAINE   SPECIMEN:  No Specimen  DISPOSITION OF SPECIMEN:  N/A  COUNTS:  YES  TOURNIQUET:  * No tourniquets in log *  DICTATION: .Dragon Dictation  Details of the procedure:   After the patient was consented patient was taken back to the operating room patient was then placed in supine position bilateral SCDs in place.  The patient was prepped and draped in the usual sterile fashion. After antibiotics were confirmed a timeout was called and all facts were verified. The Veress needle technique was used to insuflate the abdomen at Palmer's point. The abdomen was insufflated to 14 mm mercury. Subsequently a 5 mm trocar was placed a camera inserted there was no injury to any intra-abdominal organs.    There was seen to be an non incarcerated  ventral hernia 2cm.  A second camera port was in placed into the left lower quadrant.   At this the Falicform ligament was taken down with Bovie cautery maintaining hemostasis.   I proceeded to reduce the hernia contents.  Once the hernia was cleared away, a Bard Ventralight 11.4cm  mesh was inserted into the abdomen.  The mesh was secured circumferentially with am Securestrap tacker in a double crown fashion.  The omentum was brought over the area of the mesh. The pneumoperitoneum was evacuated  & all trocars  were removed. The skin was reapproximated with 4-0  Monocryl sutures in a subcuticular fashion. The skin was dressed with Steri-Strips tape and gauze.  The patient was taken to the recovery room in stable  condition.  Type of repair - Primary suture and Mesh  Mesh overlap - 5cm  Placement of mesh - underlay   PLAN OF CARE: Discharge to home after PACU  PATIENT DISPOSITION:  PACU - hemodynamically stable.   Delay start of Pharmacological VTE agent (>24hrs) due to surgical blood loss or risk of bleeding: not applicable

## 2017-10-20 NOTE — Anesthesia Postprocedure Evaluation (Signed)
Anesthesia Post Note  Patient: Savannah Phillips  Procedure(s) Performed: LAPAROSCOPIC VENTRAL HERNIA (N/A Abdomen) INSERTION OF MESH (N/A Abdomen)     Patient location during evaluation: PACU Anesthesia Type: General Level of consciousness: awake and alert Pain management: pain level controlled Vital Signs Assessment: post-procedure vital signs reviewed and stable Respiratory status: spontaneous breathing, nonlabored ventilation, respiratory function stable and patient connected to nasal cannula oxygen Cardiovascular status: blood pressure returned to baseline and stable Postop Assessment: no apparent nausea or vomiting Anesthetic complications: no    Last Vitals:  Vitals:   10/20/17 0937 10/20/17 1035  BP: 123/73 113/73  Pulse: 60 62  Resp: 16 14  Temp: (!) 36.4 C (!) 36.4 C  SpO2: 96% 93%    Last Pain:  Vitals:   10/20/17 1035  TempSrc:   PainSc: 1                  Bess Saltzman DAVID

## 2017-10-20 NOTE — H&P (Signed)
History of Present Illness  The patient is a 62 year old female who presents with an abdominal wall hernia. Patient is a 62 year old female who is referred by Dr. Creola Corn for evaluation of a ventral hernia. Patient states she's noticed a bulge the last to 59yr. She states that time its discoverable however there is no sharp pain associated with it. She does state that after eating she does notice some swelling and some pain. She's had no signs or symptoms of incarceration or granulation.  She's had no previous abdominal surgeries.   Allergies  No Known Drug Allergies [01/06/2016]: Allergies Reconciled   Medication History  Estradiol (0.0375MG /24HR Patch TW, Transdermal) Active. No Current Medications (Taken starting 08/29/2017) Estradiol (0.05MG /24HR Patch TW, Transdermal) Active. Fluorometholone (0.1% Suspension, Ophthalmic) Active. Progesterone Micronized (100MG  Capsule, Oral) Active. Medications Reconciled    Review of Systems General Present- Night Sweats. Not Present- Appetite Loss, Chills, Fatigue, Fever, Weight Gain and Weight Loss. Skin Present- Dryness. Not Present- Change in Wart/Mole, Hives, Jaundice, New Lesions, Non-Healing Wounds, Rash and Ulcer. HEENT Present- Hearing Loss, Ringing in the Ears and Wears glasses/contact lenses. Not Present- Earache, Hoarseness, Nose Bleed, Oral Ulcers, Seasonal Allergies, Sinus Pain, Sore Throat, Visual Disturbances and Yellow Eyes. Respiratory Present- Snoring. Not Present- Bloody sputum, Chronic Cough, Difficulty Breathing and Wheezing. Breast Not Present- Breast Mass, Breast Pain, Nipple Discharge and Skin Changes. Cardiovascular Present- Leg Cramps. Not Present- Chest Pain, Difficulty Breathing Lying Down, Palpitations, Rapid Heart Rate, Shortness of Breath and Swelling of Extremities. Gastrointestinal Present- Abdominal Pain, Bloating, Hemorrhoids, Indigestion and Nausea. Not Present- Bloody Stool, Change in Bowel  Habits, Chronic diarrhea, Constipation, Difficulty Swallowing, Excessive gas, Gets full quickly at meals, Rectal Pain and Vomiting. Female Genitourinary Not Present- Frequency, Nocturia, Painful Urination, Pelvic Pain and Urgency. Musculoskeletal Present- Back Pain, Joint Pain and Joint Stiffness. Not Present- Muscle Pain, Muscle Weakness and Swelling of Extremities. Neurological Present- Headaches. Not Present- Decreased Memory, Fainting, Numbness, Seizures, Tingling, Tremor, Trouble walking and Weakness. Psychiatric Not Present- Anxiety, Bipolar, Change in Sleep Pattern, Depression, Fearful and Frequent crying. Endocrine Present- Hot flashes. Not Present- Cold Intolerance, Excessive Hunger, Hair Changes, Heat Intolerance and New Diabetes. Hematology Not Present- Blood Thinners, Easy Bruising, Excessive bleeding, Gland problems, HIV and Persistent Infections.  BP 127/74   Pulse 78   Temp 98 F (36.7 C) (Oral)   Resp 18   Ht 5\' 4"  (1.626 m)   Wt 72 kg (158 lb 12.8 oz)   SpO2 93%   BMI 27.26 kg/m     Physical Exam  The physical exam findings are as follows: Note:Constitutional: No acute distress, conversant, appears stated age  Eyes: Anicteric sclerae, moist conjunctiva, no lid lag  Neck: No thyromegaly, trachea midline, no cervical lymphadenopathy  Lungs: Clear to auscultation biilaterally, normal respiratory effot  Cardiovascular: regular rate & rhythm, no murmurs, no peripheal edema, pedal pulses 2+  GI: Soft, no masses or hepatosplenomegaly, non-tender to palpation, approximate 2 cm supraumbilical midline hernia.  MSK: Normal gait, no clubbing cyanosis, edema  Skin: No rashes, palpation reveals normal skin turgor  Psychiatric: Appropriate judgment and insight, oriented to person, place, and time    Assessment & Plan  VENTRAL HERNIA WITHOUT OBSTRUCTION OR GANGRENE (K43.9) Impression: 62 year old female with a primary ventral hernia and pain  1. The  patient will like to proceed to the operating room for laparoscopic ventral hernia hernia repair with mesh.  2. I discussed with the patient the signs and symptoms of incarceration and strangulation  and the need to proceed to the ER should they occur.  3. I discussed with the patient the risks and benefits of the procedure to include but not limited to: Infection, bleeding, damage to surrounding structures, possible need for further surgery, possible nerve pain, and possible recurrence. The patient was understanding and wishes to proceed.

## 2017-10-21 ENCOUNTER — Encounter (HOSPITAL_COMMUNITY): Payer: Self-pay | Admitting: General Surgery

## 2017-11-25 DIAGNOSIS — H00021 Hordeolum internum right upper eyelid: Secondary | ICD-10-CM | POA: Diagnosis not present

## 2017-11-25 DIAGNOSIS — H00031 Abscess of right upper eyelid: Secondary | ICD-10-CM | POA: Diagnosis not present

## 2018-02-18 DIAGNOSIS — Z23 Encounter for immunization: Secondary | ICD-10-CM | POA: Diagnosis not present

## 2018-06-23 DIAGNOSIS — E559 Vitamin D deficiency, unspecified: Secondary | ICD-10-CM | POA: Diagnosis not present

## 2018-06-23 DIAGNOSIS — E7849 Other hyperlipidemia: Secondary | ICD-10-CM | POA: Diagnosis not present

## 2018-06-23 DIAGNOSIS — R82998 Other abnormal findings in urine: Secondary | ICD-10-CM | POA: Diagnosis not present

## 2018-06-27 ENCOUNTER — Other Ambulatory Visit: Payer: Self-pay | Admitting: Obstetrics and Gynecology

## 2018-06-27 DIAGNOSIS — Z1231 Encounter for screening mammogram for malignant neoplasm of breast: Secondary | ICD-10-CM

## 2018-06-30 DIAGNOSIS — R05 Cough: Secondary | ICD-10-CM | POA: Diagnosis not present

## 2018-06-30 DIAGNOSIS — Z0000 Encounter for general adult medical examination without abnormal findings: Secondary | ICD-10-CM | POA: Diagnosis not present

## 2018-06-30 DIAGNOSIS — E559 Vitamin D deficiency, unspecified: Secondary | ICD-10-CM | POA: Diagnosis not present

## 2018-06-30 DIAGNOSIS — E7849 Other hyperlipidemia: Secondary | ICD-10-CM | POA: Diagnosis not present

## 2018-06-30 DIAGNOSIS — M1990 Unspecified osteoarthritis, unspecified site: Secondary | ICD-10-CM | POA: Diagnosis not present

## 2018-06-30 DIAGNOSIS — Z1331 Encounter for screening for depression: Secondary | ICD-10-CM | POA: Diagnosis not present

## 2018-08-01 DIAGNOSIS — H02052 Trichiasis without entropian right lower eyelid: Secondary | ICD-10-CM | POA: Diagnosis not present

## 2018-08-04 ENCOUNTER — Inpatient Hospital Stay: Admission: RE | Admit: 2018-08-04 | Payer: BLUE CROSS/BLUE SHIELD | Source: Ambulatory Visit

## 2018-08-10 DIAGNOSIS — Z6827 Body mass index (BMI) 27.0-27.9, adult: Secondary | ICD-10-CM | POA: Diagnosis not present

## 2018-08-10 DIAGNOSIS — B354 Tinea corporis: Secondary | ICD-10-CM | POA: Diagnosis not present

## 2018-08-22 DIAGNOSIS — Z6826 Body mass index (BMI) 26.0-26.9, adult: Secondary | ICD-10-CM | POA: Diagnosis not present

## 2018-08-22 DIAGNOSIS — Z79890 Hormone replacement therapy (postmenopausal): Secondary | ICD-10-CM | POA: Diagnosis not present

## 2018-08-22 DIAGNOSIS — Z01419 Encounter for gynecological examination (general) (routine) without abnormal findings: Secondary | ICD-10-CM | POA: Diagnosis not present

## 2018-08-23 DIAGNOSIS — L300 Nummular dermatitis: Secondary | ICD-10-CM | POA: Diagnosis not present

## 2018-09-01 DIAGNOSIS — H00025 Hordeolum internum left lower eyelid: Secondary | ICD-10-CM | POA: Diagnosis not present

## 2018-09-21 DIAGNOSIS — D649 Anemia, unspecified: Secondary | ICD-10-CM | POA: Diagnosis not present

## 2018-11-03 ENCOUNTER — Ambulatory Visit: Payer: BLUE CROSS/BLUE SHIELD

## 2020-08-13 ENCOUNTER — Other Ambulatory Visit: Payer: Self-pay | Admitting: Obstetrics and Gynecology

## 2020-08-13 DIAGNOSIS — Z1231 Encounter for screening mammogram for malignant neoplasm of breast: Secondary | ICD-10-CM

## 2020-08-28 ENCOUNTER — Other Ambulatory Visit: Payer: Self-pay | Admitting: Internal Medicine

## 2020-08-28 DIAGNOSIS — E785 Hyperlipidemia, unspecified: Secondary | ICD-10-CM

## 2020-10-06 ENCOUNTER — Other Ambulatory Visit: Payer: Self-pay

## 2020-10-06 ENCOUNTER — Ambulatory Visit
Admission: RE | Admit: 2020-10-06 | Discharge: 2020-10-06 | Disposition: A | Discharge status: Home / Self Care | Payer: Medicare Other | Source: Ambulatory Visit | Attending: Obstetrics and Gynecology | Admitting: Obstetrics and Gynecology

## 2020-10-06 DIAGNOSIS — Z1231 Encounter for screening mammogram for malignant neoplasm of breast: Secondary | ICD-10-CM

## 2021-08-18 ENCOUNTER — Other Ambulatory Visit: Payer: Self-pay | Admitting: Internal Medicine

## 2021-08-18 DIAGNOSIS — E785 Hyperlipidemia, unspecified: Secondary | ICD-10-CM

## 2021-09-01 ENCOUNTER — Other Ambulatory Visit: Payer: Self-pay | Admitting: Obstetrics and Gynecology

## 2021-09-01 DIAGNOSIS — Z1231 Encounter for screening mammogram for malignant neoplasm of breast: Secondary | ICD-10-CM

## 2021-09-17 ENCOUNTER — Ambulatory Visit
Admission: RE | Admit: 2021-09-17 | Discharge: 2021-09-17 | Disposition: A | Discharge status: Home / Self Care | Payer: Medicare Other | Source: Ambulatory Visit | Attending: Internal Medicine | Admitting: Internal Medicine

## 2021-09-17 DIAGNOSIS — E785 Hyperlipidemia, unspecified: Secondary | ICD-10-CM

## 2021-10-07 ENCOUNTER — Ambulatory Visit: Payer: Medicare Other

## 2021-10-07 ENCOUNTER — Ambulatory Visit
Admission: RE | Admit: 2021-10-07 | Discharge: 2021-10-07 | Disposition: A | Discharge status: Home / Self Care | Payer: Medicare Other | Source: Ambulatory Visit | Attending: Obstetrics and Gynecology | Admitting: Obstetrics and Gynecology

## 2021-10-07 DIAGNOSIS — Z1231 Encounter for screening mammogram for malignant neoplasm of breast: Secondary | ICD-10-CM

## 2022-07-02 ENCOUNTER — Encounter: Payer: Self-pay | Admitting: Gastroenterology

## 2022-09-01 NOTE — Progress Notes (Signed)
Savannah Phillips    [id number]    17-Apr-1956  Primary Care Physician:Russo, Jonny Ruiz, MD  Referring Physician: Creola Corn, MD [id number] [place],  [place] [address]   Chief complaint:  Chief Complaint  Patient presents with   Hemorrhoids   Rectal Bleeding    From bleeding hemorrhoids    HPI: Savannah Phillips is a 67 y.o. very pleasant female presenting to clinic today for consult for rectal bleeding and hemorrhoids hemoorhoids at the request of her PCP Dr. Creola Corn.  Today, she complains of rectal bleeding that has been on and off for several months. She states that constipation worsens her rectal bleeding and is usually accompanied by pain. She also states that sometimes she notices a protrusion or swelling that has been preventing her from having a BM and from cleaning afterwards. She also takes Miralax daily for her constipation.  She also reports having blood in her stool in her recent exam and reports having bleeding polyps in her uterus and is planned to undergo D&C tomorrow by her gynecologist She reports intentionally losing 10-15 pounds with diet changes and minimal exercise.    She reports a possible history of colon cancer in paternal grandmother.   Denies any nausea, vomiting, abdominal pain, melena or decreased appetite  GI Hx:  She was previously followed by Dr. Bosie Clos at [facility], and has history of colon polyps Colonoscopy in 2011 Colonoscopy and EGD in 2018 Several polyps removed  (Full report not available)     Current Outpatient Medications:    estradiol (VIVELLE-DOT) 0.0375 MG/24HR, Place 1 patch onto the skin 2 (two) times a week. MONDAYS & THURSDAYS, Disp: , Rfl:    ibuprofen (ADVIL,MOTRIN) 200 MG tablet, Take 400-600 mg by mouth every 8 (eight) hours as needed (for pain.)., Disp: , Rfl:    loratadine (CLARITIN) 10 MG tablet, Take 10 mg by mouth daily., Disp: , Rfl:    omeprazole (PRILOSEC OTC) 20 MG tablet, Take 20 mg by mouth every  evening., Disp: , Rfl:    Allergies as of 09/07/2022   (No Known Allergies)    Past Medical History:  Diagnosis Date   Family history of adverse reaction to anesthesia    mother had a hard time waking up   GERD (gastroesophageal reflux disease)    Headache    migraines-rarely    Past Surgical History:  Procedure Laterality Date   FINGER SURGERY     right middle finger in middle school   INSERTION OF MESH N/A 10/20/2017   Procedure: INSERTION OF MESH;  Surgeon: Axel Filler, MD;  Location: [facility];  Service: General;  Laterality: N/A;   VENTRAL HERNIA REPAIR N/A 10/20/2017   Procedure: LAPAROSCOPIC VENTRAL HERNIA;  Surgeon: Axel Filler, MD;  Location: [facility];  Service: General;  Laterality: N/A;    Family History  Problem Relation Age of Onset   Lung cancer Maternal Uncle    Colon cancer Paternal Grandmother        unknown age    Social History   Socioeconomic History   Marital status: Single    Spouse name: Not on file   Number of children: Not on file   Years of education: Not on file   Highest education level: Not on file  Occupational History   Not on file  Tobacco Use   Smoking status: Never   Smokeless tobacco: Never  Vaping Use   Vaping Use: Never used  Substance and Sexual Activity   Alcohol use: Yes    Comment: occassionaly   Drug use: Never   Sexual activity: Not on file  Other Topics Concern   Not on file  Social History Narrative   Not on file   Social Determinants of Health   Financial Resource Strain: Not on file  Food Insecurity: Not on file  Transportation Needs: Not on file  Physical Activity: Not on file  Stress: Not on file  Social Connections: Not on file  Intimate Partner Violence: Not on file    Review of systems: Review of Systems  Constitutional:  Negative for unexpected weight change.  HENT:  Negative for trouble swallowing.   Gastrointestinal:  Positive for anal bleeding, blood in stool, constipation, rectal pain and  vomiting. Negative for abdominal distention, abdominal pain, diarrhea and nausea.     Physical Exam: Vitals:   09/07/22 0853  BP: 112/66  Pulse: 65   Body mass index is 23.34 kg/m.  General: well-appearing   Eyes: sclera anicteric, no redness ENT: oral mucosa moist without lesions, no cervical or supraclavicular lymphadenopathy CV: RRR, no JVD, no peripheral edema Resp: clear to auscultation bilaterally, normal RR and effort noted GI: soft, no tenderness, with active bowel sounds. No guarding or palpable organomegaly noted. Skin; warm and dry, no rash or jaundice noted Neuro: awake, alert and oriented x 3. Normal gross motor function and fluent speech  Rectal exam: small grade 2 -3 internal and external hemorrhoids, no nodules  Data Reviewed:  Reviewed labs, radiology imaging, old records and pertinent past GI work up   Assessment and Plan/Recommendations: 67 year old very pleasant female here for new patient visit with history of colon polyps, complaints of rectal bleeding and hemorrhoids Fecal Hemoccult positive Last colonoscopy in 2018  She has internal and external hemorrhoids on rectal exam. Will plan to proceed with colonoscopy for further evaluation of rectal bleeding and fecal Hemoccult positive test given last colonoscopy was > 5 years ago and has history of colon polyps The risks and benefits as well as alternatives of endoscopic procedure(s) have been discussed and reviewed. All questions answered. The patient agrees to proceed.  Chronic idiopathic constipation: Continue MiraLAX 1 capful daily Increase dietary fiber and water intake Add Benefiber 1 tablespoon twice daily  Symptomatic hemorrhoids: Use Anusol suppository at bedtime as needed Will plan for hemorrhoidal band ligation after colonoscopy   The patient was provided an opportunity to ask questions and all were answered. The patient agreed with the plan and demonstrated an understanding of the  instructions.  Iona Beard , MD  CC: Creola Corn, MD   Ladona Mow Hewitt Shorts as a scribe for Marsa Aris, MD.,have documented all relevant documentation on the behalf of Marsa Aris, MD,as directed by  Marsa Aris, MD while in the presence of Marsa Aris, MD.   I, Marsa Aris, MD, have reviewed all documentation for this visit. The documentation on 09/07/22 for the exam, diagnosis, procedures, and orders are all accurate and complete.

## 2022-09-07 ENCOUNTER — Ambulatory Visit: Payer: Medicare Other | Admitting: Gastroenterology

## 2022-09-07 ENCOUNTER — Encounter: Payer: Self-pay | Admitting: Gastroenterology

## 2022-09-07 VITALS — BP 112/66 | HR 65 | Ht 64.0 in | Wt 136.0 lb

## 2022-09-07 DIAGNOSIS — K641 Second degree hemorrhoids: Secondary | ICD-10-CM

## 2022-09-07 DIAGNOSIS — K625 Hemorrhage of anus and rectum: Secondary | ICD-10-CM | POA: Diagnosis not present

## 2022-09-07 DIAGNOSIS — K921 Melena: Secondary | ICD-10-CM

## 2022-09-07 MED ORDER — NA SULFATE-K SULFATE-MG SULF 17.5-3.13-1.6 GM/177ML PO SOLN: 1.0000 | Freq: Once | ORAL | 0 refills | Status: AC

## 2022-09-07 MED ORDER — HYDROCORTISONE ACETATE 25 MG RE SUPP: 25.0000 mg | Freq: Every day | RECTAL | 0 refills | Status: AC

## 2022-09-07 NOTE — Patient Instructions (Addendum)
You have been scheduled for a colonoscopy. Please follow written instructions given to you at your visit today.  Please pick up your prep supplies at the pharmacy within the next 1-3 days. If you use inhalers (even only as needed), please bring them with you on the day of your procedure.   We have sent the following medications to your pharmacy for you to pick up at your convenience:  Anusol Suppository   You have been scheduled for a hemorrhoidal banding after your colonoscopy   Take Benefiber 1 tablespoon twice a day  Continue Miralax daily  Due to recent changes in healthcare laws, you may see the results of your imaging and laboratory studies on MyChart before your provider has had a chance to review them.  We understand that in some cases there may be results that are confusing or concerning to you. Not all laboratory results come back in the same time frame and the provider may be waiting for multiple results in order to interpret others.  Please give [place] 48 hours in order for your provider to thoroughly review all the results before contacting the office for clarification of your results.  I appreciate the  opportunity to care for you  Thank You   Marsa Aris , MD

## 2022-10-07 ENCOUNTER — Encounter: Payer: Medicare Other | Admitting: Gastroenterology

## 2022-12-31 ENCOUNTER — Encounter: Payer: Medicare Other | Admitting: Gastroenterology

## 2023-03-17 ENCOUNTER — Other Ambulatory Visit: Payer: Self-pay | Admitting: Obstetrics and Gynecology

## 2023-03-17 DIAGNOSIS — Z1231 Encounter for screening mammogram for malignant neoplasm of breast: Secondary | ICD-10-CM

## 2023-04-19 ENCOUNTER — Ambulatory Visit
Admission: RE | Admit: 2023-04-19 | Discharge: 2023-04-19 | Disposition: A | Discharge status: Home / Self Care | Payer: Medicare Other | Source: Ambulatory Visit | Attending: Obstetrics and Gynecology | Admitting: Obstetrics and Gynecology

## 2023-04-19 DIAGNOSIS — Z1231 Encounter for screening mammogram for malignant neoplasm of breast: Secondary | ICD-10-CM

## 2023-11-16 IMAGING — MG MM DIGITAL SCREENING BILAT W/ TOMO AND CAD
8 series · 8 of 24 positions shown · non-contrast
Comparison: Previous exam(s).

CLINICAL DATA: Screening.

EXAM:
DIGITAL SCREENING BILATERAL MAMMOGRAM WITH TOMOSYNTHESIS AND CAD
TECHNIQUE: Bilateral screening digital craniocaudal and mediolateral oblique
mammograms were obtained. Bilateral screening digital breast
tomosynthesis was performed. The images were evaluated with
computer-aided detection.

[L CC synth-2D]
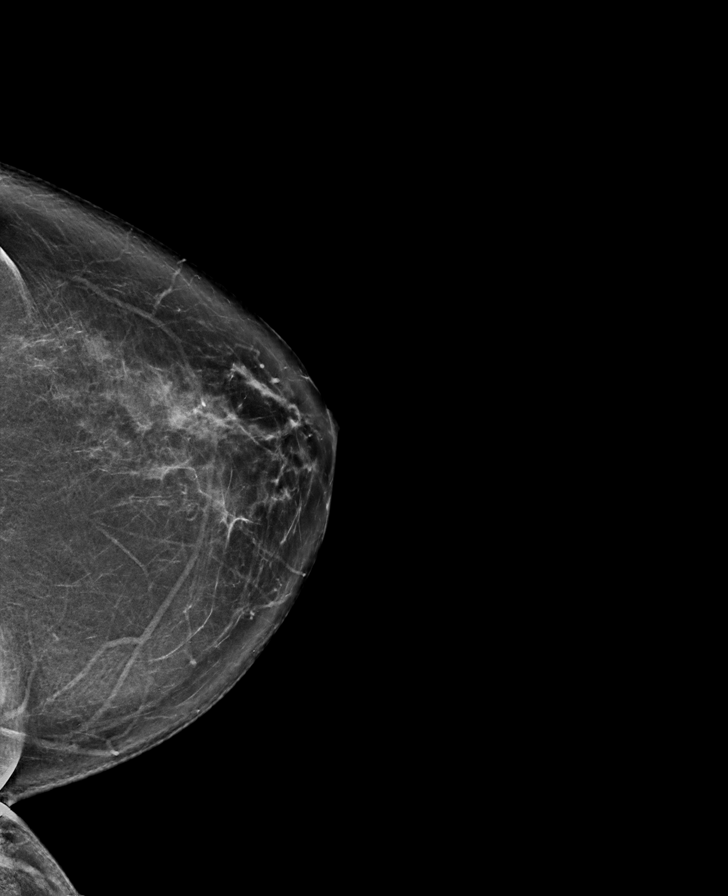

[L MLO synth-2D]
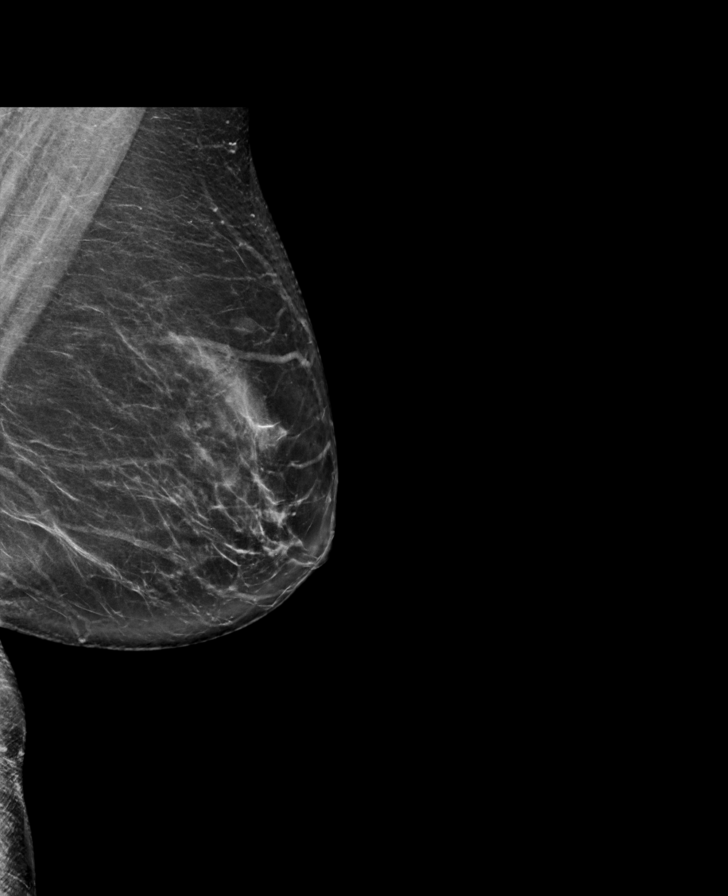

[R MLO synth-2D]
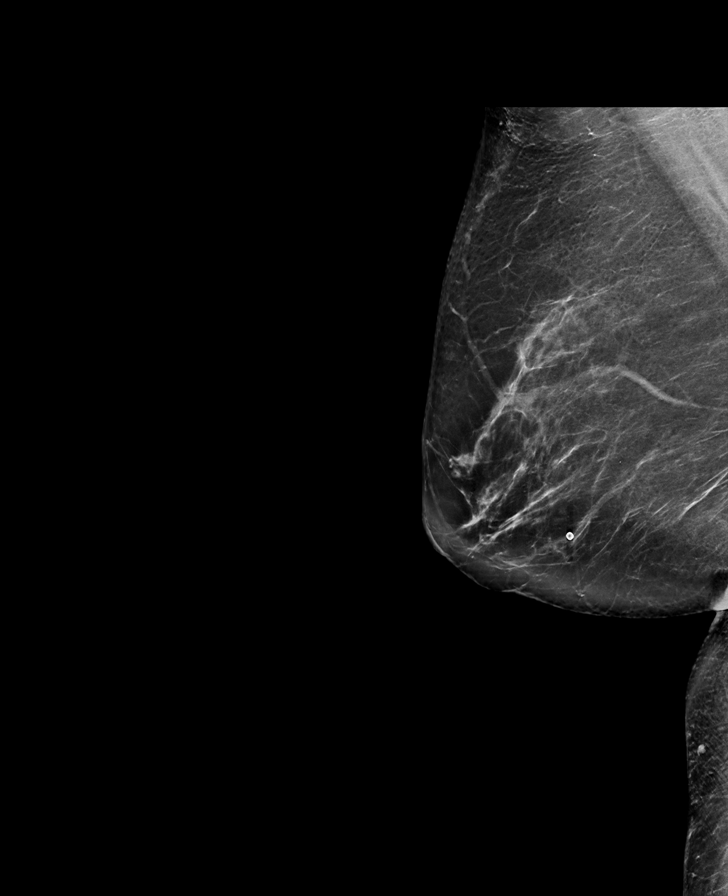

[R CC synth-2D]
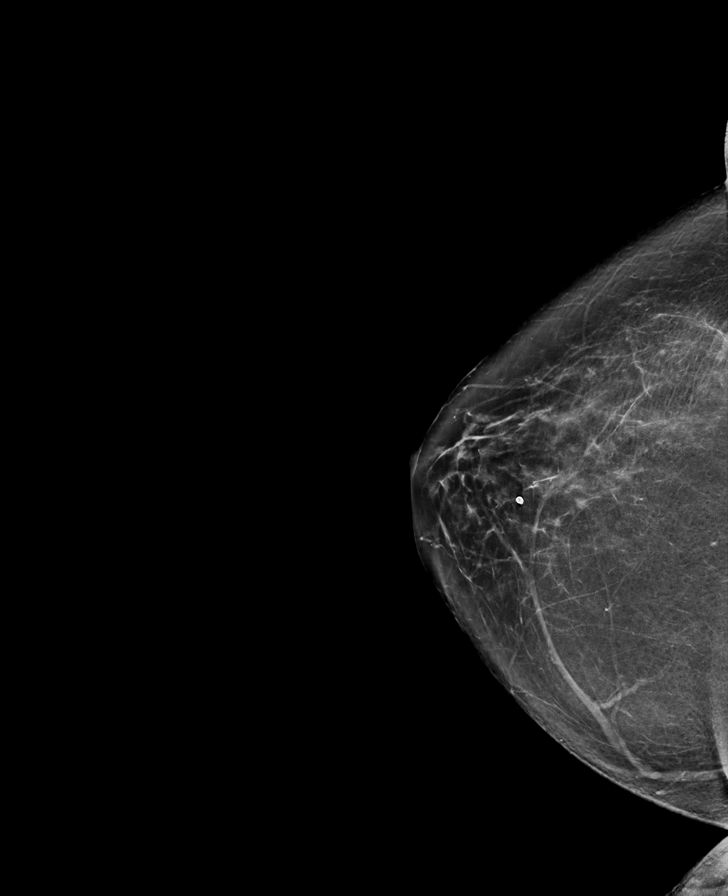

[R CC tomo · tomo slice 35/69.0]
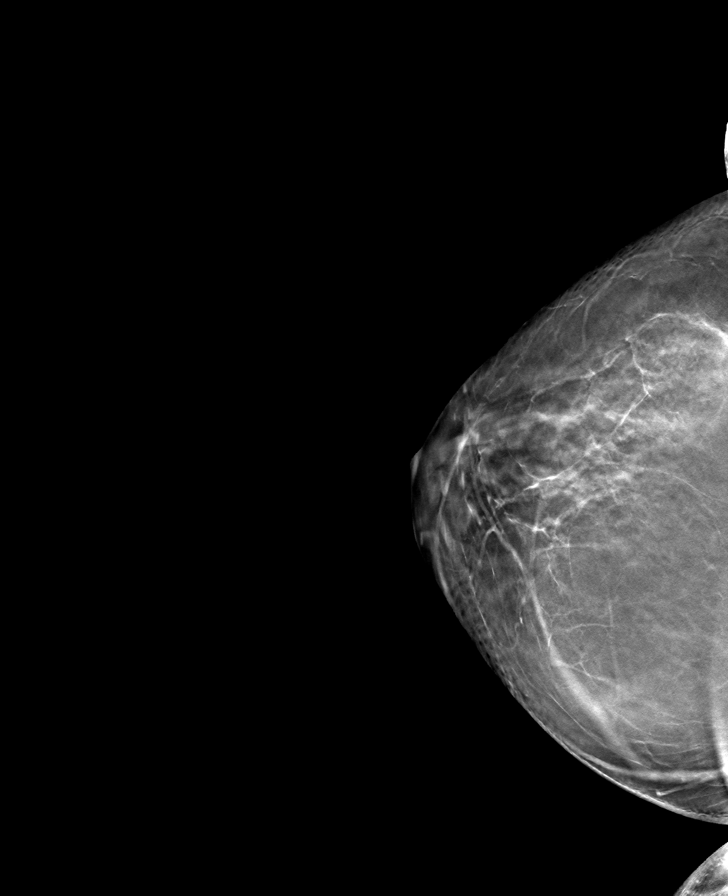

[R MLO tomo · tomo slice 41/82.0]
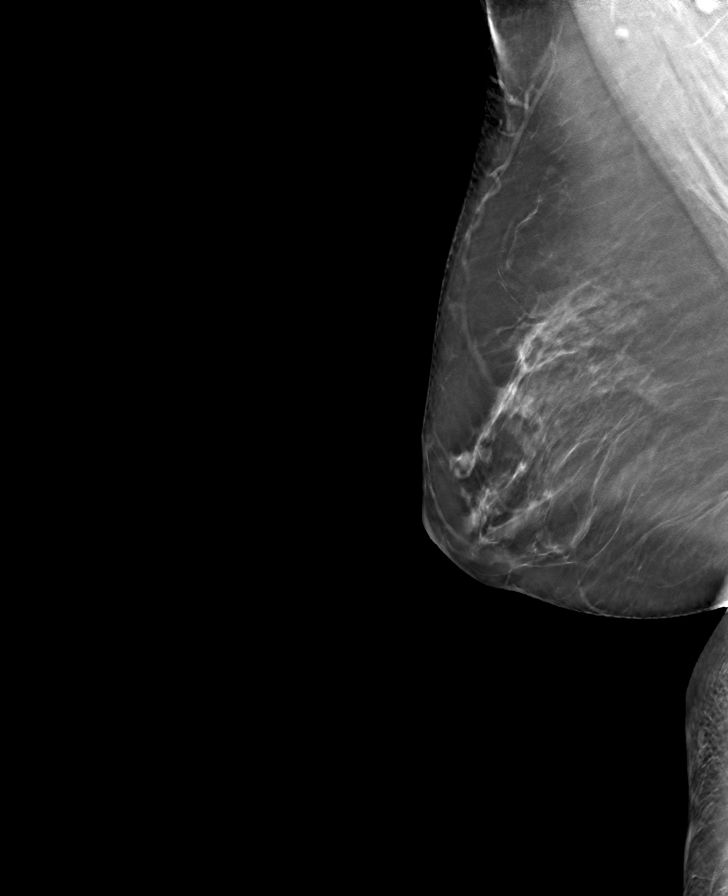

[L CC tomo · tomo slice 37/73.0]
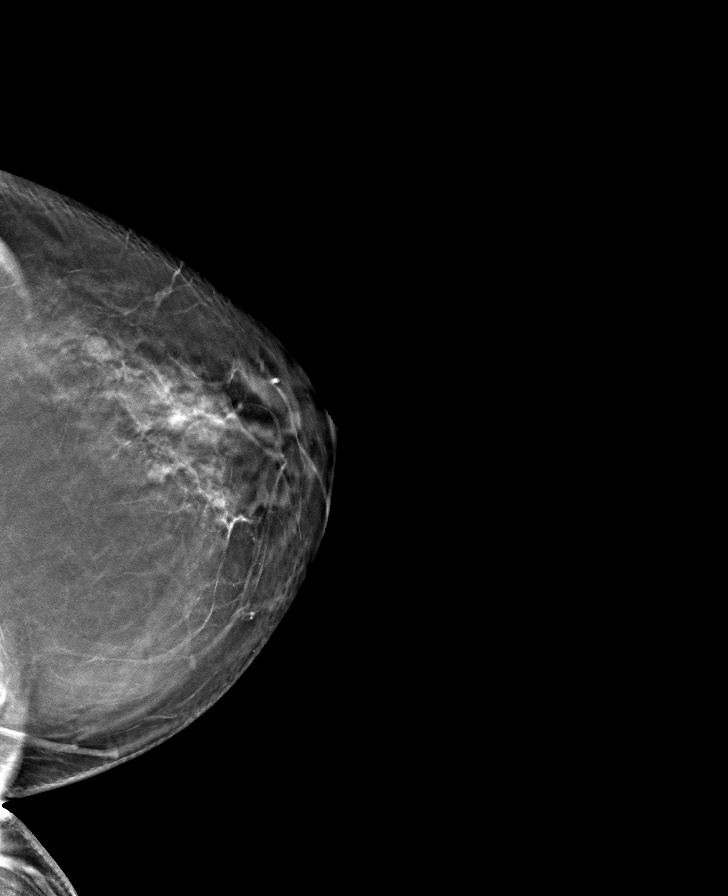

[L MLO tomo · tomo slice 40/79.0]
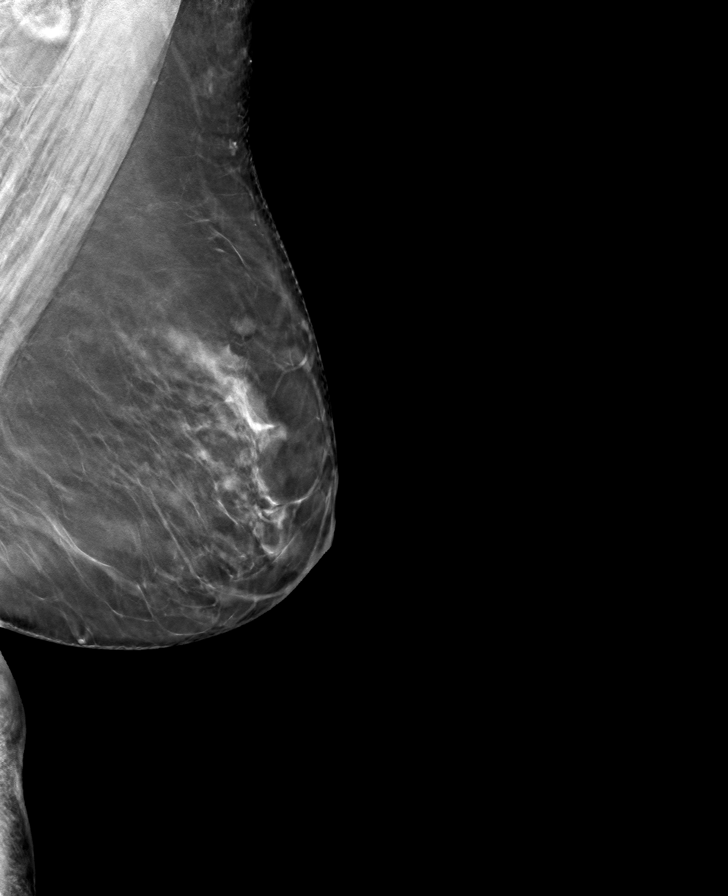

[8 of 24 positions shown; findings below may reference images not displayed]

ACR Breast Density Category b: There are scattered areas of
fibroglandular density.
FINDINGS: There are no findings suspicious for malignancy.
IMPRESSION: No mammographic evidence of malignancy. A result letter of this
screening mammogram will be mailed directly to the patient.

RECOMMENDATION:
Screening mammogram in one year. (Code:51-O-LD2)

BI-RADS CATEGORY  1: Negative.

## 2024-04-04 ENCOUNTER — Other Ambulatory Visit: Payer: Self-pay | Admitting: Obstetrics and Gynecology

## 2024-04-04 DIAGNOSIS — Z1231 Encounter for screening mammogram for malignant neoplasm of breast: Secondary | ICD-10-CM

## 2024-05-02 ENCOUNTER — Inpatient Hospital Stay
Admission: RE | Admit: 2024-05-02 | Discharge: 2024-05-02 | Attending: Obstetrics and Gynecology | Admitting: Obstetrics and Gynecology

## 2024-05-02 DIAGNOSIS — Z1231 Encounter for screening mammogram for malignant neoplasm of breast: Secondary | ICD-10-CM
